# Patient Record
Sex: Female | Born: 1985 | Race: Black or African American | Hispanic: No | Marital: Single
Health system: Southern US, Community
[De-identification: ages and names within clinical notes are randomized; demographics above are authoritative.]

## PROBLEM LIST (undated history)

## (undated) DIAGNOSIS — I4891 Unspecified atrial fibrillation: Secondary | ICD-10-CM

---

## 2018-08-04 ENCOUNTER — Other Ambulatory Visit: Payer: Self-pay

## 2018-08-04 ENCOUNTER — Emergency Department (HOSPITAL_BASED_OUTPATIENT_CLINIC_OR_DEPARTMENT_OTHER): Payer: 59

## 2018-08-04 ENCOUNTER — Emergency Department (HOSPITAL_BASED_OUTPATIENT_CLINIC_OR_DEPARTMENT_OTHER)
Admission: EM | Admit: 2018-08-04 | Discharge: 2018-08-05 | Disposition: A | Discharge status: Home / Self Care | Payer: 59 | Attending: Emergency Medicine | Admitting: Emergency Medicine

## 2018-08-04 ENCOUNTER — Encounter (HOSPITAL_BASED_OUTPATIENT_CLINIC_OR_DEPARTMENT_OTHER): Payer: Self-pay | Admitting: Emergency Medicine

## 2018-08-04 DIAGNOSIS — J029 Acute pharyngitis, unspecified: Secondary | ICD-10-CM | POA: Insufficient documentation

## 2018-08-04 HISTORY — DX: Unspecified atrial fibrillation: I48.91

## 2018-08-04 LAB — COMPREHENSIVE METABOLIC PANEL
ALT: 16 U/L (ref 0–44)
AST: 20 U/L (ref 15–41)
Albumin: 4.2 g/dL (ref 3.5–5.0)
Alkaline Phosphatase: 57 U/L (ref 38–126)
Anion gap: 7 (ref 5–15)
BUN: 7 mg/dL (ref 6–20)
CO2: 26 mmol/L (ref 22–32)
Calcium: 9.2 mg/dL (ref 8.9–10.3)
Chloride: 100 mmol/L (ref 98–111)
Creatinine, Ser: 0.54 mg/dL (ref 0.44–1.00)
GFR calc Af Amer: >60 mL/min (ref >60)
GFR calc non Af Amer: >60 mL/min (ref >60)
Glucose, Bld: 101 mg/dL — ABNORMAL HIGH (ref 70–99)
Potassium: 3.9 mmol/L (ref 3.5–5.1)
Sodium: 133 mmol/L — ABNORMAL LOW (ref 135–145)
Total Bilirubin: 0.4 mg/dL (ref 0.3–1.2)
Total Protein: 7.7 g/dL (ref 6.5–8.1)

## 2018-08-04 LAB — CBC WITH DIFFERENTIAL/PLATELET
Abs Immature Granulocytes: 0.03 10*3/uL (ref 0.00–0.07)
Basophils Absolute: 0 10*3/uL (ref 0.0–0.1)
Basophils Relative: 0 %
Eosinophils Absolute: 0.1 10*3/uL (ref 0.0–0.5)
Eosinophils Relative: 1 %
HCT: 41 % (ref 36.0–46.0)
Hemoglobin: 13.2 g/dL (ref 12.0–15.0)
IMMATURE GRANULOCYTES: 0 %
Lymphocytes Relative: 25 %
Lymphs Abs: 2 10*3/uL (ref 0.7–4.0)
MCH: 29.7 pg (ref 26.0–34.0)
MCHC: 32.2 g/dL (ref 30.0–36.0)
MCV: 92.1 fL (ref 80.0–100.0)
Monocytes Absolute: 1 10*3/uL (ref 0.1–1.0)
Monocytes Relative: 12 %
NEUTROS PCT: 62 %
Neutro Abs: 5.1 10*3/uL (ref 1.7–7.7)
PLATELETS: 318 10*3/uL (ref 150–400)
RBC: 4.45 MIL/uL (ref 3.87–5.11)
RDW: 12.4 % (ref 11.5–15.5)
WBC: 8.3 10*3/uL (ref 4.0–10.5)
nRBC: 0 % (ref 0.0–0.2)

## 2018-08-04 LAB — LACTIC ACID, PLASMA: Lactic Acid, Venous: 1 mmol/L (ref 0.5–1.9)

## 2018-08-04 LAB — HCG, SERUM, QUALITATIVE: Preg, Serum: NEGATIVE

## 2018-08-04 LAB — GROUP A STREP BY PCR: Group A Strep by PCR: NOT DETECTED

## 2018-08-04 MED ORDER — IOHEXOL 300 MG/ML  SOLN
100.0000 mL | Freq: Once | INTRAMUSCULAR | Status: AC | PRN
  Administered 2018-08-04: 75 mL via INTRAVENOUS

## 2018-08-04 MED ORDER — SODIUM CHLORIDE 0.9 % IV BOLUS
1000.0000 mL | Freq: Once | INTRAVENOUS | Status: AC
  Administered 2018-08-04: 1000 mL via INTRAVENOUS

## 2018-08-04 MED ORDER — DEXAMETHASONE SODIUM PHOSPHATE 10 MG/ML IJ SOLN
10.0000 mg | Freq: Once | INTRAMUSCULAR | Status: AC
  Administered 2018-08-04: 10 mg via INTRAVENOUS
  Filled 2018-08-04: qty 1

## 2018-08-04 MED ORDER — HYDROCODONE-ACETAMINOPHEN 7.5-325 MG/15ML PO SOLN
10.0000 mL | Freq: Once | ORAL | Status: AC
  Administered 2018-08-04: 10 mL via ORAL
  Filled 2018-08-04: qty 15

## 2018-08-04 NOTE — ED Provider Notes (Signed)
[facility] EMERGENCY DEPARTMENT Provider Note   CSN: [id number] Arrival date & time: 08/04/18  2041    History   Chief Complaint Chief Complaint  Patient presents with   Sore Throat    HPI Angela Freeman is a 33 y.o. female.     HPI  Patient is a 33 year old female with a past medical history of atrial fibrillation, temporarily off of Xarelto, presenting for sore throat.  Patient reports that the course of her illness initially began 2 weeks ago when she was diagnosed with influenza A.  At that time, patient reports that she had fever up to 103, cough, and shortness of breath.  She was treated at [facility].  She reports that subsequently she had persistent cough, sputum production and scant hemoptysis.  She was then started on Levaquin 5 days ago and has 1 more day to complete.  Patient reports that today on her drive home from work she began having fairly sudden onset of sore throat.  She reports that she feels that there are "ulcers" in her throat.  She denies any lip or tongue swelling, pharyngeal edema or erythema, or difficulty breathing.  She just reports that she cannot swallow due to the pain.  Last fever was 48 hours.  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  She denies any known exposures to COVID-19 and has not traveled.   Past Medical History:  Diagnosis Date   Atrial fibrillation (HCC)     There are no active problems to display for this patient.   OB History   No obstetric history on file.      Home Medications    Prior to Admission medications   Medication Sig Start Date End Date Taking? Authorizing Provider  levofloxacin (LEVAQUIN) 500 MG tablet Take 500 mg by mouth daily.   Yes [provider]  rivaroxaban (XARELTO) 10 MG TABS tablet Take 10 mg by mouth daily.   Yes [provider]    Family History No family history on file.  Social History Social History   Tobacco  Use   Smoking status: Not on file  Substance Use Topics   Alcohol use: Not on file   Drug use: Not on file     Allergies   Sulfa antibiotics   Review of Systems Review of Systems  Constitutional: Negative for chills and fever.  HENT: Positive for sore throat, trouble swallowing and voice change. Negative for congestion, rhinorrhea and sinus pain.   Eyes: Negative for visual disturbance.  Respiratory: Positive for cough. Negative for chest tightness, shortness of breath and stridor.   Cardiovascular: Negative for chest pain, palpitations and leg swelling.  Gastrointestinal: Negative for abdominal pain, constipation, nausea and vomiting.  Genitourinary: Negative for dysuria and flank pain.  Musculoskeletal: Negative for back pain and myalgias.  Skin: Negative for rash.  Neurological: Negative for dizziness, syncope, light-headedness and headaches.     Physical Exam Updated Vital Signs BP 110/75 (BP Location: Right Arm)    Pulse 92    Temp 98.8 F (37.1 C) (Oral)    Resp 20    SpO2 100%   Physical Exam Vitals signs and nursing note reviewed.  Constitutional:      General: She is not in acute distress.    Appearance: She is well-developed.  HENT:     Head: Normocephalic and atraumatic.     Nose: Nose normal.     Mouth/Throat:     Mouth: Mucous  membranes are moist.     Tonsils: 1+ on the right. 1+ on the left.     Comments: Phonation hoarse on initial exam. No muffled voice sounds. Patient swallows secretions without difficulty. Dentition normal. No lesions of tongue or buccal mucosa. Uvula midline. No asymmetric swelling of the posterior pharynx. Erythema of posterior pharynx present. No tonsillar exuduate. No lingual swelling. No induration inferior to tongue. No submandibular tenderness, swelling, or induration.  Tissues of the neck supple. No cervical lymphadenopathy. Right TM without erythema or effusion; left TM without erythema or effusion.  Eyes:     Extraocular  Movements: Extraocular movements intact.     Conjunctiva/sclera: Conjunctivae normal.     Pupils: Pupils are equal, round, and reactive to light.  Neck:     Musculoskeletal: Normal range of motion and neck supple. No neck rigidity.     Comments: No nuchal rigidity.  No meningismus. Cardiovascular:     Rate and Rhythm: Normal rate and regular rhythm.     Heart sounds: S1 normal and S2 normal. No murmur.  Pulmonary:     Effort: Pulmonary effort is normal.     Breath sounds: Normal breath sounds. No wheezing or rales.  Abdominal:     General: There is no distension.     Palpations: Abdomen is soft.     Tenderness: There is no abdominal tenderness. There is no guarding.  Musculoskeletal: Normal range of motion.        General: No deformity.  Lymphadenopathy:     Cervical: No cervical adenopathy.  Skin:    General: Skin is warm and dry.     Findings: No erythema or rash.  Neurological:     Mental Status: She is alert.     Comments: Cranial nerves grossly intact. Patient moves extremities symmetrically and with good coordination.  Psychiatric:        Behavior: Behavior normal.        Thought Content: Thought content normal.        Judgment: Judgment normal.      ED Treatments / Results  Labs (all labs ordered are listed, but only abnormal results are displayed) Labs Reviewed  COMPREHENSIVE METABOLIC PANEL - Abnormal; Notable for the following components:      Result Value   Sodium 133 (*)    Glucose, Bld 101 (*)    All other components within normal limits  GROUP A STREP BY PCR  CBC WITH DIFFERENTIAL/PLATELET  HCG, SERUM, QUALITATIVE  LACTIC ACID, PLASMA  LACTIC ACID, PLASMA    EKG None  Radiology Ct Soft Tissue Neck W Contrast  Result Date: 08/04/2018 CLINICAL DATA:  Dysphagia, difficulty speaking and intermittent fever for a few days. Recent diagnosis of pneumonia. EXAM: CT NECK WITH CONTRAST TECHNIQUE: Multidetector CT imaging of the neck was performed using the  standard protocol following the bolus administration of intravenous contrast. CONTRAST:  47mL OMNIPAQUE IOHEXOL 300 MG/ML  SOLN COMPARISON:  None. FINDINGS: PHARYNX AND LARYNX: Mild fullness of the adenoid, palatine and lingual tonsils. Normal larynx. Widely patent airway. SALIVARY GLANDS: Normal. THYROID: Normal. LYMPH NODES: No lymphadenopathy by CT size criteria. VASCULAR: Normal. LIMITED INTRACRANIAL: Normal. VISUALIZED ORBITS: Normal. MASTOIDS AND VISUALIZED PARANASAL SINUSES: Moderate paranasal sinus mucosal thickening, RIGHT sphenoid mucosal retention cysts. SKELETON: Nonacute.  Calcified stylohyoid ligaments. UPPER CHEST: Lung apices are clear. No superior mediastinal lymphadenopathy. OTHER: None. IMPRESSION: 1. Mild hypertrophy of adenoids, palatine and lingual tonsil seen with recent viral illness. Patent airway without drainable fluid collection. Electronically Signed  By: Awilda Metro M.D.   On: 08/04/2018 22:48   Dg Chest Portable 1 View  Result Date: 08/04/2018 CLINICAL DATA:  Cough.  Flu. EXAM: PORTABLE CHEST 1 VIEW COMPARISON:  None FINDINGS: The heart size and mediastinal contours are within normal limits. Both lungs are clear. The visualized skeletal structures are unremarkable. IMPRESSION: No active disease. Electronically Signed   By: Signa Kell M.D.   On: 08/04/2018 23:09    Procedures Procedures (including critical care time)  Medications Ordered in ED Medications  sodium chloride 0.9 % bolus 1,000 mL (1,000 mLs Intravenous New Bag/Given 08/04/18 2231)  dexamethasone (DECADRON) injection 10 mg (10 mg Intravenous Given 08/04/18 2250)  HYDROcodone-acetaminophen (HYCET) 7.5-325 mg/15 ml solution 10 mL (10 mLs Oral Given 08/04/18 2228)  iohexol (OMNIPAQUE) 300 MG/ML solution 100 mL (75 mLs Intravenous Contrast Given 08/04/18 2214)     Initial Impression / Assessment and Plan / ED Course  I have reviewed the triage vital signs and the nursing notes.  Pertinent labs &  imaging results that were available during my care of the patient were reviewed by me and considered in my medical decision making (see chart for details).  Clinical Course as of Aug 05 2  Tue Aug 05, 2018  0003 Patient verbally verified a safe ride from the ED. Proceeded with prescribing Hycet for pain/relaxtion/muscle relaxation in the ED.    [AM]    Clinical Course User Index [AM] Elisha Ponder, PA-C      Patient is nontoxic-appearing here in emergency department, afebrile, and not tachypneic or tachycardic.  Oxygen saturation is 100% on room air.  Patient is exhibiting a hoarse voice, but voice sounds are not muffled and she is not in any respiratory distress.  Patient reports main complaint is severe pain with swallowing.  This is fairly sudden onset.  She has had a approximately 2-week history of illness beginning with a diagnosis of influenza A.  Given the progression of patient symptoms, and the post viral symptoms she appears to be having, also considering autoimmune disorder.  Patient ports that her primary care provider is also trying to get her into see a rheumatologist.  Patient may also have other viral pharyngitis.  Examination not demonstrating ulcers consistent with herpes pharyngitis. No evidence of airway closure of compromise.   Regarding patient's scant hemoptysis, feel this is likely secondary to bronchial irritation given 2 weeks of coughing.  She does not have any large clots of blood and is no longer taking a blood thinner. No shortness to breath to suggest PE as cause of hemoptysis.   Work-up demonstrating negative strep PCR.  There is no leukocytosis.  No significant electrolyte derangements.  Lactic acid is normal.  Chest x-ray demonstrating resolution of prior infiltrates.  CT neck soft tissue demonstrate no evidence of epiglottitis, deep space infection, but does show some mild hypertrophy of adenoids.  Patient tolerating p.o. here in emergency department  without difficulty.  Will prescribe Hycodan for cough and severe sore throat as well as viscous lidocaine.  Patient directed not to drive, drink alcohol, or operate machinery while taking this medication.  Patient is instructed to do an ED visit with her primary care provider in 48 hours for recheck of symptoms.  Return precautions given for any difficulty breathing, swelling, or inability to tolerate p.o.  Patient is in understanding and agrees with the plan of care.  Angela Freeman was evaluated in Emergency Department on 08/04/2018 for the symptoms described in the history of  present illness. She was evaluated in the context of the global COVID-19 pandemic, which necessitated consideration that the patient might be at risk for infection with the SARS-CoV-2 virus that causes COVID-19. Institutional protocols and algorithms that pertain to the evaluation of patients at risk for COVID-19 are in a state of rapid change based on information released by regulatory bodies including the CDC and federal and state organizations. These policies and algorithms were followed during the patient's care in the ED.  This is a supervised visit with Dr. Loren Racer. Evaluation, management, and discharge planning discussed with this attending physician.  Final Clinical Impressions(s) / ED Diagnoses   Final diagnoses:  Sore throat    ED Discharge Orders         Ordered    HYDROcodone-homatropine (HYCODAN) 5-1.5 MG/5ML syrup  Every 6 hours PRN     08/05/18 0008    lidocaine (XYLOCAINE) 2 % solution  As needed     08/05/18 0008           Delia Chimes 08/05/18 0015    Loren Racer, MD 08/05/18 2316

## 2018-08-04 NOTE — ED Notes (Addendum)
C/o of throat-burning sensation. Showed a picture of her thick oral secretion with scant blood.  Frequent salivation noted.

## 2018-08-04 NOTE — ED Triage Notes (Signed)
PT with muffled voice, states she cant swallow. Sx for 2 days. Oxygen 100% on r/a. Some swelling of throat noted, not occluded.

## 2018-08-04 NOTE — ED Triage Notes (Signed)
Fever off and on and dx with pnuemonia at [facility] 3/27. Per friend on phone.

## 2018-08-05 ENCOUNTER — Telehealth (HOSPITAL_BASED_OUTPATIENT_CLINIC_OR_DEPARTMENT_OTHER): Payer: Self-pay | Admitting: Emergency Medicine

## 2018-08-05 MED ORDER — HYDROCODONE-HOMATROPINE 5-1.5 MG/5ML PO SYRP: 5.0000 mL | ORAL_SOLUTION | Freq: Four times a day (QID) | ORAL | 0 refills | Status: AC | PRN

## 2018-08-05 MED ORDER — HYDROCODONE-HOMATROPINE 5-1.5 MG/5ML PO SYRP: 5.0000 mL | ORAL_SOLUTION | Freq: Four times a day (QID) | ORAL | 0 refills | Status: DC | PRN

## 2018-08-05 MED ORDER — LIDOCAINE VISCOUS HCL 2 % MT SOLN: 15.0000 mL | OROMUCOSAL | 0 refills | Status: AC | PRN

## 2018-08-05 NOTE — Discharge Instructions (Addendum)
Please read and follow all provided instructions.  Your diagnoses today include:  1. Sore throat     Tests performed today include: Strep test: was negative for strep throat Strep culture: you will be notified if this comes back positive Vital signs. See below for your results today.   Your work-up is very reassuring today.  Your lab work looks normal today.  Your chest x-ray looks like the pneumonia is clearing.  There is no evidence of a deep infection in the neck.  Medications prescribed:   You have been prescribed Hycodan for pain. This is an opioid pain medication. You may take this medication every 6 hours as needed for pain. Only take this medication if you need it for breakthrough pain.   You may take Tylenol 650 mg every 6 hours as needed for pain.   Do not combine this medication with alcohol.  Please be advised to avoid driving or operating heavy machinery while taking this medication, as it may make you drowsy or impair judgment.   You are prescribed viscous lidocaine to gargle, swish and spit every 6 hours as needed for tooth and gum discomfort. Do not swallow.   Home care instructions:  Please read the educational materials provided and follow any instructions contained in this packet.  Follow-up instructions: Please follow-up with your primary care provider as needed for further evaluation of your symptoms.  Return instructions:  Please return to the Emergency Department if you experience worsening symptoms.  Return if you have worsening problems swallowing, your neck becomes swollen, you cannot swallow your saliva or your voice becomes muffled.  Return with high persistent fever, persistent vomiting, or if you have trouble breathing.  Please return if you have any other emergent concerns.  Additional Information:  Your vital signs today were: BP 109/85    Pulse 82    Temp 98.8 F (37.1 C) (Oral)    Resp 16    SpO2 100%  If your blood pressure (BP) was  elevated above 135/85 this visit, please have this repeated by your doctor within one month. --------------

## 2018-08-05 NOTE — Telephone Encounter (Signed)
[facility] called and does not have the hycodan prescribed last night. This will be sent to [facility] at [facility] in [place] per patient request

## 2019-09-28 IMAGING — CT CT NECK WITH CONTRAST
3 of 4 series · 13 of 33 positions shown, 16 images · IV contrast (omnipaque)
Comparison: None.

CLINICAL DATA: Dysphagia, difficulty speaking and intermittent
fever for a few days. Recent diagnosis of pneumonia.

EXAM:
CT NECK WITH CONTRAST
TECHNIQUE: Multidetector CT imaging of the neck was performed using the
standard protocol following the bolus administration of intravenous
contrast.
CONTRAST:  75mL OMNIPAQUE IOHEXOL 300 MG/ML  SOLN

[Series 5: sag neck · sagittal · 0.51mm/px · 5 of 108 slices shown, 6 images]
[im 36/108  bone]
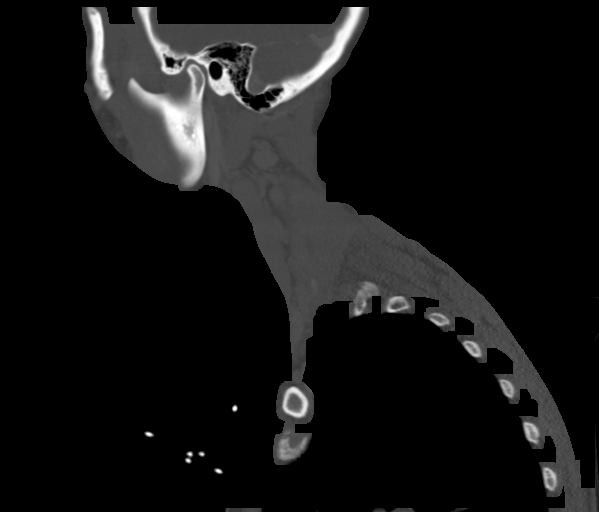
[im 45/108  bone]
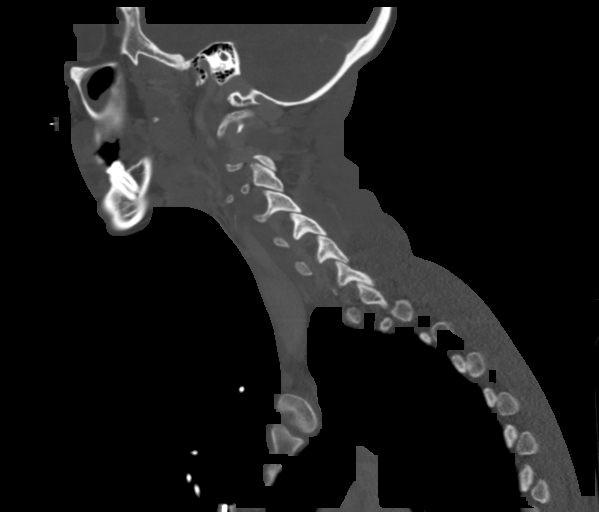
[im 54/108  soft-tissue]
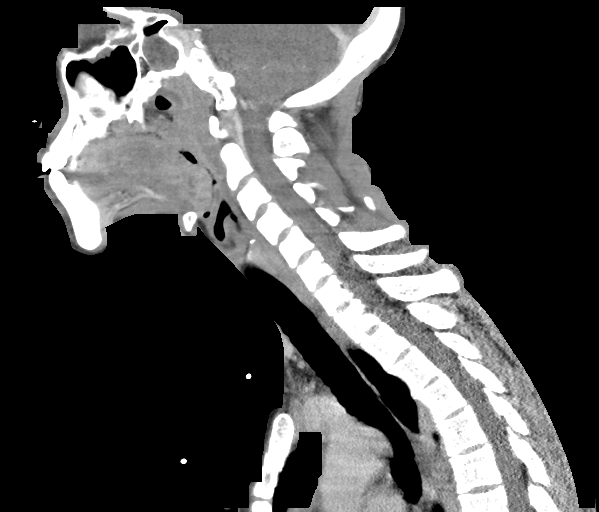
[im 54/108  bone]
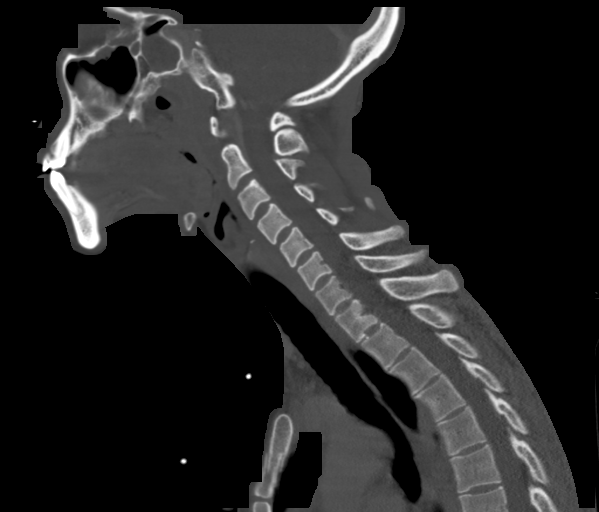
[im 63/108  bone]
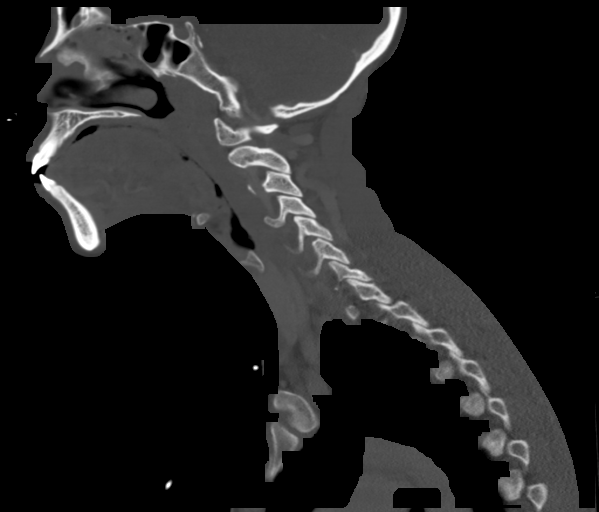
[im 72/108  bone]
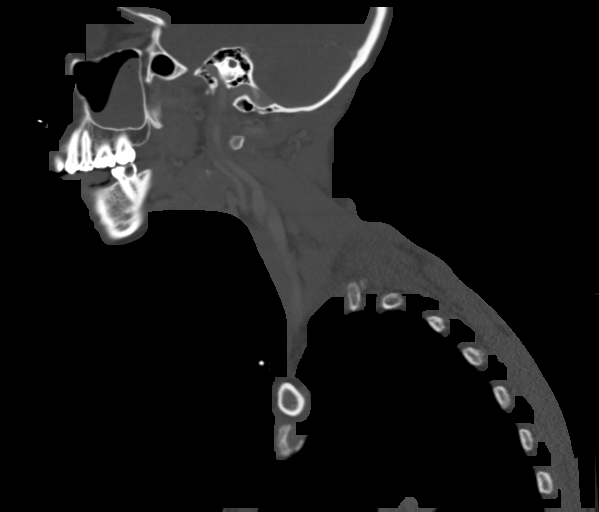

[Series 6: cor neck · coronal · 0.44mm/px · 3 of 151 slices shown]
[im 31/151  bone]
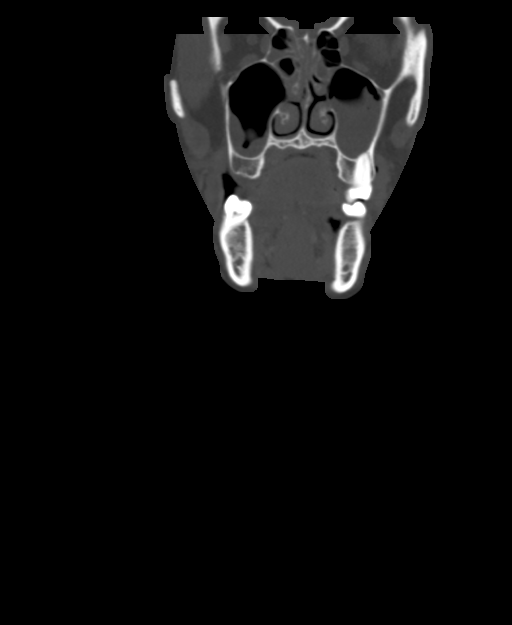
[im 61/151  bone]
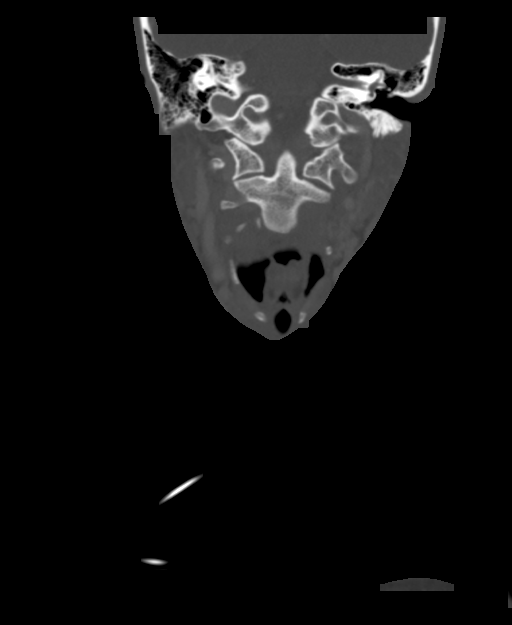
[im 91/151  bone]
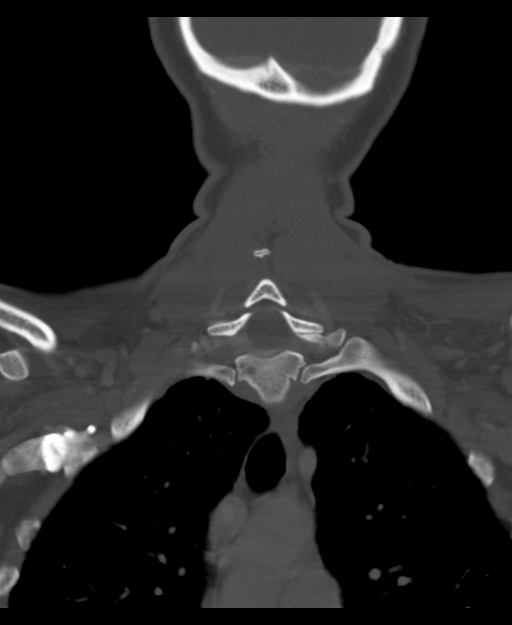

[Series 7: orthogonal ax · axial · 0.49mm/px · z∈[-316,-138]mm · 5 of 136 slices shown, 7 images]
[im 23/136  soft-tissue]
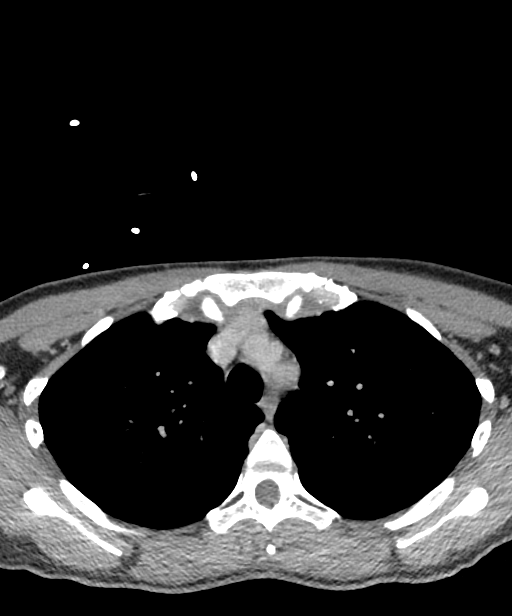
[im 23/136  bone]
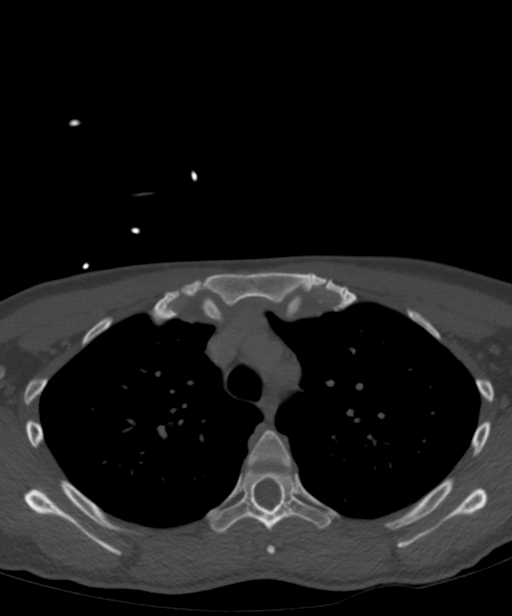
[im 46/136  bone]
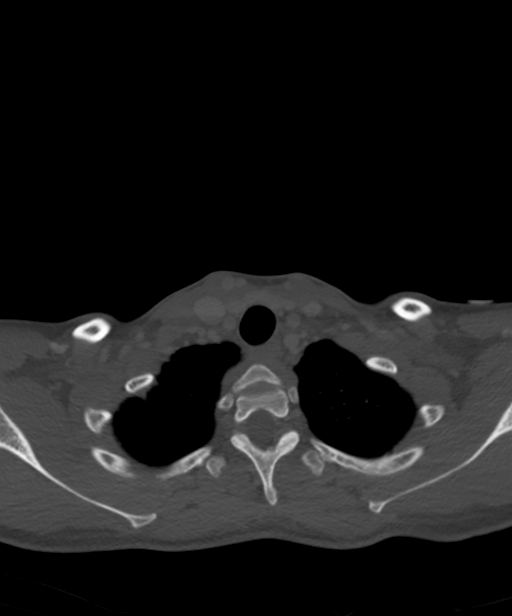
[im 68/136  bone]
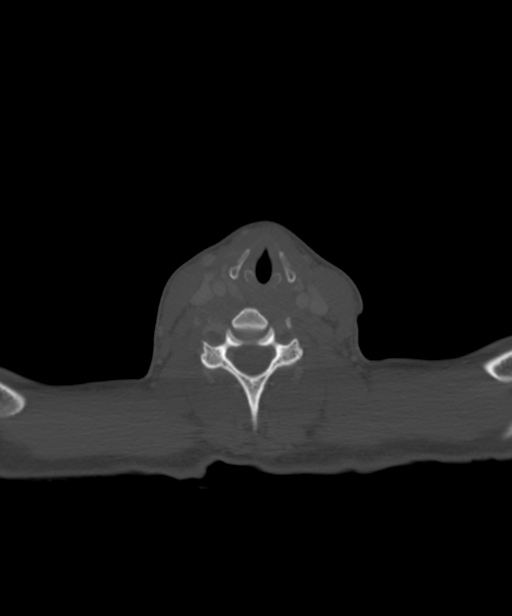
[im 91/136  bone]
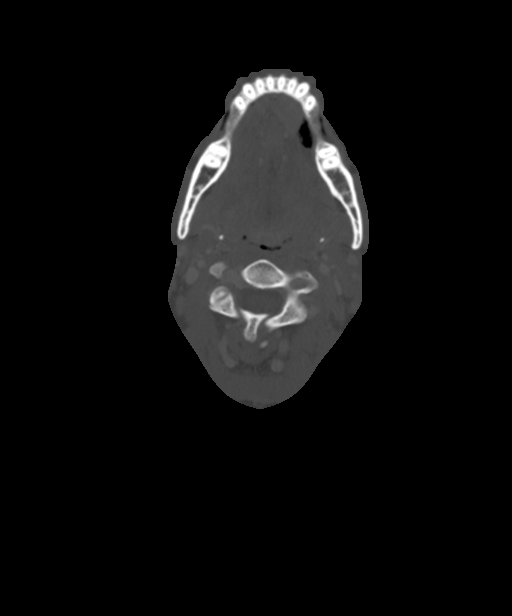
[im 113/136  soft-tissue]
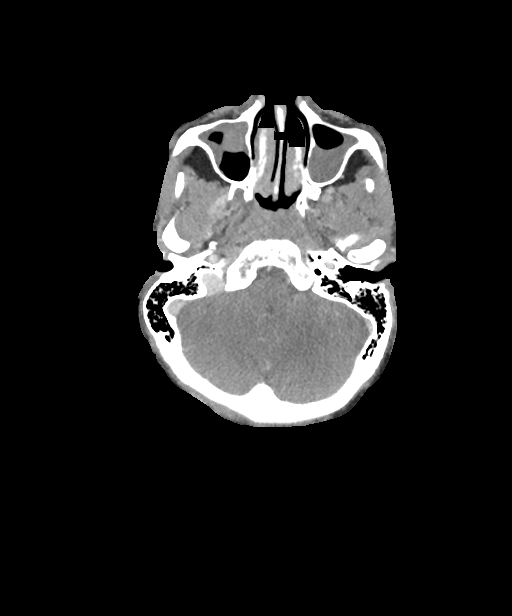
[im 113/136  bone]
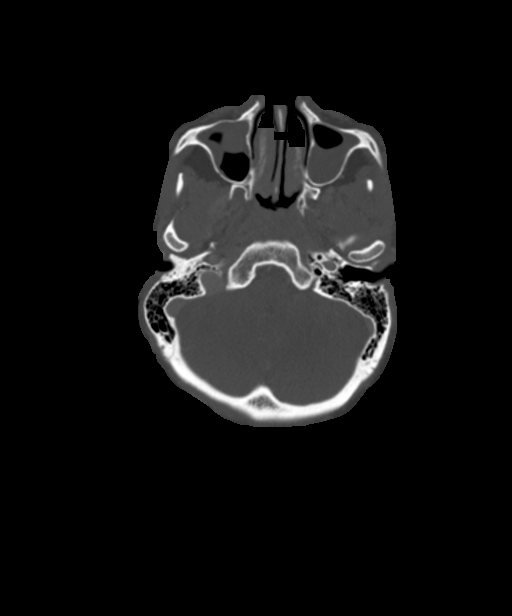

[13 of 33 positions shown; findings below may reference images not displayed]

FINDINGS: PHARYNX AND LARYNX: Mild fullness of the adenoid, palatine and
lingual tonsils. Normal larynx. Widely patent airway.

SALIVARY GLANDS: Normal.

THYROID: Normal.

LYMPH NODES: No lymphadenopathy by CT size criteria.

VASCULAR: Normal.

LIMITED INTRACRANIAL: Normal.

VISUALIZED ORBITS: Normal.

MASTOIDS AND VISUALIZED PARANASAL SINUSES: Moderate paranasal sinus
mucosal thickening, RIGHT sphenoid mucosal retention cysts.

SKELETON: Nonacute.  Calcified stylohyoid ligaments.

UPPER CHEST: Lung apices are clear. No superior mediastinal
lymphadenopathy.

OTHER: None.
IMPRESSION: 1. Mild hypertrophy of adenoids, palatine and lingual tonsil seen
with recent viral illness. Patent airway without drainable fluid
collection.
